# Patient Record
Sex: Male | Born: 1990 | Race: Black or African American | Hispanic: No | Marital: Married | Smoking: Never smoker
Health system: Southern US, Community
[De-identification: ages and names within clinical notes are randomized; demographics above are authoritative.]

---
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
## 2013-12-09 ENCOUNTER — Emergency Department (HOSPITAL_COMMUNITY)
Admission: EM | Admit: 2013-12-09 | Discharge: 2013-12-09 | Disposition: A | Discharge status: Home / Self Care | Payer: Self-pay | Attending: Emergency Medicine | Admitting: Emergency Medicine

## 2013-12-09 ENCOUNTER — Encounter (HOSPITAL_COMMUNITY): Payer: Self-pay | Admitting: Emergency Medicine

## 2013-12-09 DIAGNOSIS — Y9389 Activity, other specified: Secondary | ICD-10-CM | POA: Insufficient documentation

## 2013-12-09 DIAGNOSIS — Z041 Encounter for examination and observation following transport accident: Secondary | ICD-10-CM | POA: Insufficient documentation

## 2013-12-09 DIAGNOSIS — Y92410 Unspecified street and highway as the place of occurrence of the external cause: Secondary | ICD-10-CM | POA: Insufficient documentation

## 2013-12-09 MED ORDER — NAPROXEN 500 MG PO TABS: 500.0000 mg | ORAL_TABLET | Freq: Two times a day (BID) | ORAL | Status: AC
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
## 2013-12-11 NOTE — ED Provider Notes (Signed)
Medical screening examination/treatment/procedure(s) were performed by non-physician practitioner and as supervising physician I was immediately available for consultation/collaboration.   EKG Interpretation None        Brodey Bonn T Prajwal Fellner, MD 12/11/13 2333 

## 2014-10-19 ENCOUNTER — Ambulatory Visit (INDEPENDENT_AMBULATORY_CARE_PROVIDER_SITE_OTHER): Payer: Self-pay | Admitting: Family Medicine

## 2014-10-19 VITALS — BP 102/66 | HR 62 | Temp 98.0°F | Resp 18 | Ht 73.0 in | Wt 187.0 lb

## 2014-10-19 DIAGNOSIS — M79675 Pain in left toe(s): Secondary | ICD-10-CM

## 2014-10-19 DIAGNOSIS — L600 Ingrowing nail: Secondary | ICD-10-CM

## 2014-10-19 MED ORDER — TRAMADOL HCL 50 MG PO TABS: ORAL_TABLET | ORAL | Status: AC

## 2014-10-19 MED ORDER — AMOXICILLIN-POT CLAVULANATE 875-125 MG PO TABS: 1.0000 | ORAL_TABLET | Freq: Two times a day (BID) | ORAL | Status: AC

## 2014-10-19 NOTE — Patient Instructions (Addendum)
INGROWN TOENAIL . Keep area clean, dry and bandaged for 24 hours. . After 24 hours, remove outer bandage and leave yellow gauze in place. Nuala Alpha toe/foot in warm soapy water for 5-10 minutes, once daily for 5 days. Rebandage toe after each cleaning. . Continue soaks until yellow gauze falls off. . Notify the office if you experience any of the following signs of infection: Swelling, redness, pus drainage, streaking, fever > 101.0 F   Take tramadol every 6 hours only if needed for severe pain  Take ibuprofen 800 mg 3 times daily as needed for milder pain  Take Augmentin 875 one twice daily for infection  Return if forms or concerns

## 2014-10-19 NOTE — Progress Notes (Signed)
Ingrown toenail Subjective:  Patient ID: Patrick Nunez, male    DOB: 1991-01-21  Age: 24 y.o. MRN: [id number]  For the past several weeks patient has been having pain in his left large toe. He plays ball. His job is standing around in a debilitated wears comfortable shoes that. He has had some ingrowing in the past, but it always come on down. This is worse.   Objective:   No major distress. Left large toenail medially is badly ingrown. The lateral aspect has had a bit of pus around and also.  Assessment & Plan:   Assessment:  Ingrown toenail left large toe  Plan:  Discussed treatment options. He agrees he would like to have the medial aspect of the nail removed. Will treat with antibiotics also.  There are no Patient Instructions on file for this visit.   Melquan Ernsberger, MD 10/19/2014
